# Patient Record
Sex: Female | Born: 1980
Health system: Southern US, Community
[De-identification: ages and names within clinical notes are randomized; demographics above are authoritative.]

## PROBLEM LIST (undated history)

## (undated) DIAGNOSIS — F329 Major depressive disorder, single episode, unspecified: Secondary | ICD-10-CM

## (undated) DIAGNOSIS — F32A Depression, unspecified: Secondary | ICD-10-CM

## (undated) DIAGNOSIS — F419 Anxiety disorder, unspecified: Secondary | ICD-10-CM

---

## 2015-10-22 ENCOUNTER — Emergency Department (HOSPITAL_BASED_OUTPATIENT_CLINIC_OR_DEPARTMENT_OTHER)
Admission: EM | Admit: 2015-10-22 | Discharge: 2015-10-22 | Disposition: A | Discharge status: Home / Self Care | Payer: Medicaid - Out of State | Attending: Emergency Medicine | Admitting: Emergency Medicine

## 2015-10-22 ENCOUNTER — Encounter (HOSPITAL_BASED_OUTPATIENT_CLINIC_OR_DEPARTMENT_OTHER): Payer: Self-pay | Admitting: *Deleted

## 2015-10-22 DIAGNOSIS — B029 Zoster without complications: Secondary | ICD-10-CM | POA: Insufficient documentation

## 2015-10-22 DIAGNOSIS — M25511 Pain in right shoulder: Secondary | ICD-10-CM | POA: Diagnosis present

## 2015-10-22 MED ORDER — HYDROCODONE-ACETAMINOPHEN 5-325 MG PO TABS: 1.0000 | ORAL_TABLET | Freq: Four times a day (QID) | ORAL | Status: DC | PRN

## 2015-10-22 MED ORDER — ACYCLOVIR 400 MG PO TABS: 400.0000 mg | ORAL_TABLET | Freq: Four times a day (QID) | ORAL | Status: DC

## 2015-10-22 MED ORDER — HYDROCODONE-ACETAMINOPHEN 5-325 MG PO TABS
1.0000 | ORAL_TABLET | Freq: Once | ORAL | Status: AC
  Administered 2015-10-22: 1 via ORAL
  Filled 2015-10-22: qty 1

## 2015-10-22 NOTE — ED Notes (Signed)
Right scapula pain x 3 days. No injury. States pain is worse when she takes a deep breath. Denies cough.

## 2015-10-22 NOTE — ED Notes (Signed)
MD at bedside. 

## 2015-10-22 NOTE — ED Provider Notes (Signed)
CSN: [id number]     Arrival date & time 10/22/15  2127 History  By signing my name below, I, Karen Acosta, attest that this documentation has been prepared under the direction and in the presence of Karen Memos, MD. Electronically Signed: Octavia Acosta, ED Scribe. 10/22/2015. 9:57 PM.    Chief Complaint  Patient presents with  . Shoulder Pain      The history is provided by the patient. No language interpreter was used.   HPI Comments: Karen Acosta is a 34 y.o. female who has a hx of chicken pox presents to the Emergency Department complaining of constant, sharp, stabbing, gradual worsening scapula pain onset 3 days ago. She notes having a fever blister pop up today. Pt states increased pain with deep breaths and with movement. 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 Pt denies fever, injury to the area, cough, shortness of breath, swelling in legs, hx of blood clots, recent surgeries, recent travel, abdominal pain, and chest pain. She has no known drug allergies  History reviewed. No pertinent past medical history. Past Surgical History  Procedure Laterality Date  . Cesarean section     No family history on file. Social History  Substance Use Topics  . Smoking status: Never Smoker   . Smokeless tobacco: None  . Alcohol Use: No   OB History    No data available     Review of Systems  Constitutional: Negative for fever.  Respiratory: Negative for cough and shortness of breath.   Cardiovascular: Negative for chest pain.  Gastrointestinal: Negative for abdominal pain.  All other systems reviewed and are negative.     Allergies  Review of patient's allergies indicates no known allergies.  Home Medications   Prior to Admission medications   Medication Sig Start Date End Date Taking? Authorizing Provider  acyclovir (ZOVIRAX) 400 MG tablet Take 1 tablet (400 mg total) by mouth 4 (four) times daily. 10/22/15   Karen Memos, MD   HYDROcodone-acetaminophen (NORCO/VICODIN) 5-325 MG tablet Take 1 tablet by mouth every 6 (six) hours as needed for moderate pain. 10/22/15   Karen Memos, MD   Triage vitals: BP 118/89 mmHg  Pulse 80  Temp(Src) 97.6 F (36.4 C) (Oral)  Resp 18  Ht 5' (1.524 m)  Wt 168 lb (76.204 kg)  BMI 32.81 kg/m2  SpO2 100% Physical Exam  Constitutional: She is oriented to person, place, and time. She appears well-developed and well-nourished. No distress.  HENT:  Head: Normocephalic and atraumatic.  Eyes: EOM are normal.  Neck: Normal range of motion.  Cardiovascular: Normal rate, regular rhythm and normal heart sounds.   Pulmonary/Chest: Effort normal and breath sounds normal.  Abdominal: Soft. She exhibits no distension. There is no tenderness.  Musculoskeletal: Normal range of motion.  2 vesicles periscapular region, slight erythema, no TTP  Neurological: She is alert and oriented to person, place, and time.  Skin: Skin is warm and dry.  Psychiatric: She has a normal mood and affect. Judgment normal.  Nursing note and vitals reviewed.   ED Course  Procedures  DIAGNOSTIC STUDIES: Oxygen Saturation is 100% on RA, normal by my interpretation.  COORDINATION OF CARE:  9:54 PM Discussed treatment plan which includes anti-viral and pain medication with pt at bedside and pt agreed to plan.  Labs Review Labs Reviewed - No data to display  Imaging Review No results found. I have personally reviewed and evaluated these images and lab results as part of  my medical decision-making.   EKG Interpretation None      MDM   Final diagnoses:  Shingles    Concern for likely shingles vs muscular pain. Does have two vesicles around scapula and 'deep pain'. PERC negative, no fever or cough to suggest pneumonia. Also complains of a fever blister on her upper left lip, so has likely been under some kind of stress recently to cause the symptoms. Will dc on antivirals and pain meds.   I  personally performed the services described in this documentation, which was scribed in my presence. The recorded information has been reviewed and is accurate. Cheyenne AdasE   Juwuan Sedita, MD 10/23/15 [phone]

## 2016-01-31 ENCOUNTER — Encounter (HOSPITAL_BASED_OUTPATIENT_CLINIC_OR_DEPARTMENT_OTHER): Payer: Self-pay | Admitting: *Deleted

## 2016-01-31 ENCOUNTER — Emergency Department (HOSPITAL_BASED_OUTPATIENT_CLINIC_OR_DEPARTMENT_OTHER)
Admission: EM | Admit: 2016-01-31 | Discharge: 2016-01-31 | Disposition: A | Discharge status: Home / Self Care | Payer: Medicaid - Out of State | Attending: Emergency Medicine | Admitting: Emergency Medicine

## 2016-01-31 DIAGNOSIS — R109 Unspecified abdominal pain: Secondary | ICD-10-CM | POA: Diagnosis present

## 2016-01-31 DIAGNOSIS — Z3202 Encounter for pregnancy test, result negative: Secondary | ICD-10-CM | POA: Diagnosis not present

## 2016-01-31 DIAGNOSIS — Z79899 Other long term (current) drug therapy: Secondary | ICD-10-CM | POA: Diagnosis not present

## 2016-01-31 DIAGNOSIS — K529 Noninfective gastroenteritis and colitis, unspecified: Secondary | ICD-10-CM | POA: Diagnosis not present

## 2016-01-31 DIAGNOSIS — R51 Headache: Secondary | ICD-10-CM | POA: Insufficient documentation

## 2016-01-31 LAB — BASIC METABOLIC PANEL
ANION GAP: 10 (ref 5–15)
BUN: 8 mg/dL (ref 6–20)
CO2: 20 mmol/L — ABNORMAL LOW (ref 22–32)
Calcium: 8.9 mg/dL (ref 8.9–10.3)
Chloride: 107 mmol/L (ref 101–111)
Creatinine, Ser: 0.58 mg/dL (ref 0.44–1.00)
GFR calc Af Amer: >60 mL/min (ref >60)
Glucose, Bld: 112 mg/dL — ABNORMAL HIGH (ref 65–99)
POTASSIUM: 4 mmol/L (ref 3.5–5.1)
SODIUM: 137 mmol/L (ref 135–145)

## 2016-01-31 LAB — URINALYSIS, ROUTINE W REFLEX MICROSCOPIC
Bilirubin Urine: NEGATIVE
Glucose, UA: NEGATIVE mg/dL
HGB URINE DIPSTICK: NEGATIVE
KETONES UR: NEGATIVE mg/dL
LEUKOCYTES UA: NEGATIVE
NITRITE: NEGATIVE
PROTEIN: NEGATIVE mg/dL
Specific Gravity, Urine: 1.02 (ref 1.005–1.030)
pH: 6.5 (ref 5.0–8.0)

## 2016-01-31 LAB — CBC WITH DIFFERENTIAL/PLATELET
BASOS ABS: 0 10*3/uL (ref 0.0–0.1)
BASOS PCT: 0 %
EOS PCT: 2 %
Eosinophils Absolute: 0.1 10*3/uL (ref 0.0–0.7)
HCT: 37.5 % (ref 36.0–46.0)
Hemoglobin: 12.8 g/dL (ref 12.0–15.0)
LYMPHS PCT: 22 %
Lymphs Abs: 1.3 10*3/uL (ref 0.7–4.0)
MCH: 29.8 pg (ref 26.0–34.0)
MCHC: 34.1 g/dL (ref 30.0–36.0)
MCV: 87.2 fL (ref 78.0–100.0)
Monocytes Absolute: 0.6 10*3/uL (ref 0.1–1.0)
Monocytes Relative: 9 %
Neutro Abs: 4 10*3/uL (ref 1.7–7.7)
Neutrophils Relative %: 67 %
PLATELETS: 249 10*3/uL (ref 150–400)
RBC: 4.3 MIL/uL (ref 3.87–5.11)
RDW: 13 % (ref 11.5–15.5)
WBC: 5.9 10*3/uL (ref 4.0–10.5)

## 2016-01-31 LAB — PREGNANCY, URINE: PREG TEST UR: NEGATIVE

## 2016-01-31 MED ORDER — KETOROLAC TROMETHAMINE 30 MG/ML IJ SOLN
30.0000 mg | Freq: Once | INTRAMUSCULAR | Status: AC
  Administered 2016-01-31: 30 mg via INTRAVENOUS
  Filled 2016-01-31: qty 1

## 2016-01-31 MED ORDER — SODIUM CHLORIDE 0.9 % IV BOLUS (SEPSIS)
1000.0000 mL | Freq: Once | INTRAVENOUS | Status: AC
  Administered 2016-01-31: 1000 mL via INTRAVENOUS

## 2016-01-31 NOTE — ED Provider Notes (Signed)
CSN: [id number]     Arrival date & time 01/31/16  1510 History  By signing my name below, I, Tanda RockersMargaux Venter, attest that this documentation has been prepared under the direction and in the presence of Rolan BuccoMelanie Contrell Ballentine, MD. Electronically Signed: Tanda RockersMargaux Venter, ED Scribe. 01/31/2016. 3:41 PM.   Chief Complaint  Patient presents with  . Abdominal Pain  . Emesis  . Diarrhea   The history is provided by the patient. No language interpreter was used.     HPI Comments: Karen Acosta is a 35 y.o. female who presents to the Emergency Department complaining of nausea and vomiting x 3 days. She reports 3 episodes of vomiting today. She did drink apple juice earlier today and was able to keep it down. Pt also complains of a frontal headache, intermittent abdominal pain, diarrhea, and fever with Tmax 102. Pt mentions that the fever has since resolved. Her temperature in the ED is 98.5 F. She has been taking Ibuprofen and Tylenol for the headache with some relief. Denies dysuria, rhinorrhea, congestion, cough, or any other associated symptoms. LNMP: 01/16/2016.   History reviewed. No pertinent past medical history. Past Surgical History  Procedure Laterality Date  . Cesarean section     History reviewed. No pertinent family history. Social History  Substance Use Topics  . Smoking status: Never Smoker   . Smokeless tobacco: None  . Alcohol Use: No   OB History    No data available     Review of Systems  Constitutional: Positive for fever. Negative for chills, diaphoresis and fatigue.  HENT: Negative for congestion, rhinorrhea and sneezing.   Eyes: Negative.   Respiratory: Negative for cough, chest tightness and shortness of breath.   Cardiovascular: Negative for chest pain and leg swelling.  Gastrointestinal: Positive for nausea, vomiting, abdominal pain and diarrhea. Negative for blood in stool.  Genitourinary: Negative for dysuria, frequency, hematuria, flank pain and difficulty urinating.   Musculoskeletal: Negative for back pain and arthralgias.  Skin: Negative for rash.  Neurological: Positive for headaches. Negative for dizziness, speech difficulty, weakness and numbness.    Allergies  Review of patient's allergies indicates no known allergies.  Home Medications   Prior to Admission medications   Medication Sig Start Date End Date Taking? Authorizing Provider  acyclovir (ZOVIRAX) 400 MG tablet Take 1 tablet (400 mg total) by mouth 4 (four) times daily. 10/22/15   Marily MemosJason Mesner, MD  HYDROcodone-acetaminophen (NORCO/VICODIN) 5-325 MG tablet Take 1 tablet by mouth every 6 (six) hours as needed for moderate pain. 10/22/15   Jason Mesner, MD   BP 100/64 mmHg  Pulse 90  Temp(Src) 98.5 F (36.9 C) (Oral)  Resp 20  Ht 5\' 2"  (1.575 m)  Wt 166 lb (75.297 kg)  BMI 30.35 kg/m2  SpO2 100%  LMP 01/16/2016   Physical Exam  Constitutional: She is oriented to person, place, and time. She appears well-developed and well-nourished.  HENT:  Head: Normocephalic and atraumatic.  Eyes: Pupils are equal, round, and reactive to light.  Neck: Normal range of motion. Neck supple.  Cardiovascular: Normal rate, regular rhythm and normal heart sounds.   Pulmonary/Chest: Effort normal and breath sounds normal. No respiratory distress. She has no wheezes. She has no rales. She exhibits no tenderness.  Abdominal: Soft. Bowel sounds are normal. There is tenderness. There is no rebound and no guarding.  Mild tenderness across lower abdomen bilaterally  Musculoskeletal: Normal range of motion. She exhibits no edema.  Lymphadenopathy:    She has no cervical  adenopathy.  Neurological: She is alert and oriented to person, place, and time.  Skin: Skin is warm and dry. No rash noted.  Psychiatric: She has a normal mood and affect.    ED Course  Procedures (including critical care time)  DIAGNOSTIC STUDIES: Oxygen Saturation is 100% on RA, normal by my interpretation.    COORDINATION OF  CARE: 3:39 PM-Discussed treatment plan which includes IV Fluids with pt at bedside and pt agreed to plan.   Labs Review Results for orders placed or performed during the hospital encounter of 01/31/16  Basic metabolic panel  Result Value Ref Range   Sodium 137 135 - 145 mmol/L   Potassium 4.0 3.5 - 5.1 mmol/L   Chloride 107 101 - 111 mmol/L   CO2 20 (L) 22 - 32 mmol/L   Glucose, Bld 112 (H) 65 - 99 mg/dL   BUN 8 6 - 20 mg/dL   Creatinine, Ser 1.61 0.44 - 1.00 mg/dL   Calcium 8.9 8.9 - 09.6 mg/dL   GFR calc non Af Amer >60 >60 mL/min   GFR calc Af Amer >60 >60 mL/min   Anion gap 10 5 - 15  CBC with Differential  Result Value Ref Range   WBC 5.9 4.0 - 10.5 K/uL   RBC 4.30 3.87 - 5.11 MIL/uL   Hemoglobin 12.8 12.0 - 15.0 g/dL   HCT 04.5 40.9 - 81.1 %   MCV 87.2 78.0 - 100.0 fL   MCH 29.8 26.0 - 34.0 pg   MCHC 34.1 30.0 - 36.0 g/dL   RDW 91.4 78.2 - 95.6 %   Platelets 249 150 - 400 K/uL   Neutrophils Relative % 67 %   Neutro Abs 4.0 1.7 - 7.7 K/uL   Lymphocytes Relative 22 %   Lymphs Abs 1.3 0.7 - 4.0 K/uL   Monocytes Relative 9 %   Monocytes Absolute 0.6 0.1 - 1.0 K/uL   Eosinophils Relative 2 %   Eosinophils Absolute 0.1 0.0 - 0.7 K/uL   Basophils Relative 0 %   Basophils Absolute 0.0 0.0 - 0.1 K/uL  Urinalysis, Routine w reflex microscopic  Result Value Ref Range   Color, Urine YELLOW YELLOW   APPearance CLEAR CLEAR   Specific Gravity, Urine 1.020 1.005 - 1.030   pH 6.5 5.0 - 8.0   Glucose, UA NEGATIVE NEGATIVE mg/dL   Hgb urine dipstick NEGATIVE NEGATIVE   Bilirubin Urine NEGATIVE NEGATIVE   Ketones, ur NEGATIVE NEGATIVE mg/dL   Protein, ur NEGATIVE NEGATIVE mg/dL   Nitrite NEGATIVE NEGATIVE   Leukocytes, UA NEGATIVE NEGATIVE  Pregnancy, urine  Result Value Ref Range   Preg Test, Ur NEGATIVE NEGATIVE   No results found.    Imaging Review No results found. I have personally reviewed and evaluated these lab results as part of my medical decision-making.    EKG Interpretation None      MDM   Final diagnoses:  Gastroenteritis   Patient presents with nausea vomiting diarrhea and fever. She's been fever free for 24 hours. She's tolerating by mouth fluids. Her abdominal exam is benign. She was given IV fluids and a dose of Toradol for her headache. She states she's feeling much better. She's drinking without difficulty. She states her headache is resolved. She has only minimal tenderness in her suprapubic area but states it's almost resolved. Her urinalysis is negative without infection. Her pregnancy test is negative. I feel her symptoms are most consistent with a viral gastroenteritis. She was discharged home in good condition.  She was advised to use clear liquids for the next 24 hours and slowly progress after that. Return precautions were given.  I personally performed the services described in this documentation, which was scribed in my presence.  The recorded information has been reviewed and considered.      Rolan Bucco, MD 01/31/16 [id number]

## 2016-01-31 NOTE — ED Notes (Signed)
Per pt and spouse pt has been experiencing n/v/d and abd pain w/ associated headache x3 days - fever of 102.7 at home.

## 2016-03-09 ENCOUNTER — Encounter (HOSPITAL_BASED_OUTPATIENT_CLINIC_OR_DEPARTMENT_OTHER): Payer: Self-pay | Admitting: *Deleted

## 2016-03-09 ENCOUNTER — Emergency Department (HOSPITAL_BASED_OUTPATIENT_CLINIC_OR_DEPARTMENT_OTHER)
Admission: EM | Admit: 2016-03-09 | Discharge: 2016-03-09 | Disposition: A | Discharge status: Home / Self Care | Payer: Medicaid - Out of State | Attending: Emergency Medicine | Admitting: Emergency Medicine

## 2016-03-09 DIAGNOSIS — J029 Acute pharyngitis, unspecified: Secondary | ICD-10-CM | POA: Diagnosis present

## 2016-03-09 DIAGNOSIS — H109 Unspecified conjunctivitis: Secondary | ICD-10-CM | POA: Insufficient documentation

## 2016-03-09 DIAGNOSIS — J309 Allergic rhinitis, unspecified: Secondary | ICD-10-CM | POA: Insufficient documentation

## 2016-03-09 LAB — RAPID STREP SCREEN (MED CTR MEBANE ONLY): STREPTOCOCCUS, GROUP A SCREEN (DIRECT): NEGATIVE

## 2016-03-09 MED ORDER — POLYMYXIN B-TRIMETHOPRIM 10000-0.1 UNIT/ML-% OP SOLN: 1.0000 [drp] | OPHTHALMIC | Status: DC

## 2016-03-09 NOTE — ED Provider Notes (Signed)
CSN: [id number]     Arrival date & time 03/09/16  1842 History   First MD Initiated Contact with Patient 03/09/16 1924     Chief Complaint  Patient presents with  . Sore Throat     (Consider location/radiation/quality/duration/timing/severity/associated sxs/prior Treatment) HPI   Blood pressure 113/79, pulse 87, temperature 98.1 F (36.7 C), temperature source Oral, resp. rate 18, height 4\' 11"  (1.499 m), weight 72.576 kg, last menstrual period 02/10/2016, SpO2 99 %.  Karen Acosta is a 35 y.o. female complaining of emergency department for complaints and eye discharge x 3 days. States three days ago she awoke with her eyes crusted shut with associated yellow discharge throughout the day Has progressed to both eyes upon awakening this morning. Has been taking OTC allergy eye drops without relief. Denies fever, visual changes, trauma, eye pain or photophobia. Also reports an increase in her typical seasonal allergies, corzya, pruritis of the throat and ears, and sneezing. She denies trying any relieving medications for this. Denies dysphagia, odonophagia, chest pain, sob, doe or cough.   History reviewed. No pertinent past medical history. Past Surgical History  Procedure Laterality Date  . Cesarean section     History reviewed. No pertinent family history. Social History  Substance Use Topics  . Smoking status: Never Smoker   . Smokeless tobacco: None  . Alcohol Use: No   OB History    No data available     Review of Systems  10 systems reviewed and found to be negative, except as noted in the HPI.  Allergies  Review of patient's allergies indicates no known allergies.  Home Medications   Prior to Admission medications   Not on File   BP 113/79 mmHg  Pulse 87  Temp(Src) 98.1 F (36.7 C) (Oral)  Resp 18  Ht 4\' 11"  (1.499 m)  Wt 72.576 kg  BMI 32.30 kg/m2  SpO2 99%  LMP 02/10/2016 Physical Exam  Constitutional: She is oriented to person, place, and time. She  appears well-developed and well-nourished. No distress.  HENT:  Head: Normocephalic and atraumatic.  Right Ear: External ear normal.  Left Ear: External ear normal.  Mouth/Throat: Oropharynx is clear and moist. No oropharyngeal exudate.  No drooling or stridor. Posterior pharynx mildly erythematous no significant tonsillar hypertrophy. No exudate. Soft palate rises symmetrically. No TTP or induration under tongue.   No tenderness to palpation of frontal or bilateral maxillary sinuses.  Mild mucosal edema in the nares with scant rhinorrhea.  Bilateral tympanic membranes with normal architecture and good light reflex.    Eyes: EOM are normal. Pupils are equal, round, and reactive to light.  Slight bilateral conjunctival injection with clear discharge.  Neck: Normal range of motion. Neck supple.  Cardiovascular: Normal rate, regular rhythm and intact distal pulses.   Pulmonary/Chest: Effort normal and breath sounds normal. No stridor. No respiratory distress. She has no wheezes. She has no rales. She exhibits no tenderness.  Abdominal: Soft. Bowel sounds are normal. She exhibits no distension and no mass. There is no tenderness. There is no rebound and no guarding.  Musculoskeletal: Normal range of motion.  Neurological: She is alert and oriented to person, place, and time.  Psychiatric: She has a normal mood and affect.  Nursing note and vitals reviewed.   ED Course  Procedures (including critical care time) Labs Review Labs Reviewed  RAPID STREP SCREEN (NOT AT [facility])  CULTURE, GROUP A STREP [facility])    Imaging Review No results found. I have personally reviewed and  evaluated these images and lab results as part of my medical decision-making.   EKG Interpretation None      MDM   Final diagnoses:  Bilateral conjunctivitis  Allergic rhinitis, unspecified allergic rhinitis type    Filed Vitals:   03/09/16 1856  BP: 113/79  Pulse: 87  Temp: 98.1 F (36.7 C)  TempSrc:  Oral  Resp: 18  Height:  (1.499 m)  Weight: 72.576 kg  SpO2: 99%    Karen Acosta is 35 y.o. female presenting with Symptoms consistent with allergic rhinitis. Also reporting crusting eye wakening. Very mild conjunctival injection. I think the symptoms are likely related to her allergies however, daughter is also sick with conjunctivitis, will start her on Polytrim. Patient is only taking over-the-counter allergy eyedrops, recommend Zyrtec and close follow with primary care, referral to allergist given.  Evaluation does not show pathology that would require ongoing emergent intervention or inpatient treatment. Pt is hemodynamically stable and mentating appropriately. Discussed findings and plan with patient/guardian, who agrees with care plan. All questions answered. Return precautions discussed and outpatient follow up given.   Discharge Medication List as of 03/09/2016  7:59 PM    START taking these medications   Details  trimethoprim-polymyxin b (POLYTRIM) ophthalmic solution Place 1 drop into both eyes every 4 (four) hours., Starting 03/09/2016, Until Discontinued, Print             Wynetta Emery, PA-C 03/09/16 2137  Melene Plan, DO 03/09/16 2233

## 2016-03-09 NOTE — Discharge Instructions (Signed)
Takes Zyrtec over-the-counter as instructed.  Please follow with your primary care doctor in the next 2 days for a check-up. They must obtain records for further management.   Do not hesitate to return to the Emergency Department for any new, worsening or concerning symptoms.    Allergies An allergy is an abnormal reaction to a substance by the body's defense system (immune system). Allergies can develop at any age. WHAT CAUSES ALLERGIES? An allergic reaction happens when the immune system mistakenly reacts to a normally harmless substance, called an allergen, as if it were harmful. The immune system releases antibodies to fight the substance. Antibodies eventually release a chemical called histamine into the bloodstream. The release of histamine is meant to protect the body from infection, but it also causes discomfort. An allergic reaction can be triggered by:  Eating an allergen.  Inhaling an allergen.  Touching an allergen. WHAT TYPES OF ALLERGIES ARE THERE? There are many types of allergies. Common types include:  Seasonal allergies. People with this type of allergy are usually allergic to substances that are only present during certain seasons, such as molds and pollens.  Food allergies.  Drug allergies.  Insect allergies.  Animal dander allergies. WHAT ARE SYMPTOMS OF ALLERGIES? Possible allergy symptoms include:  Swelling of the lips, face, tongue, mouth, or throat.  Sneezing, coughing, or wheezing.  Nasal congestion.  Tingling in the mouth.  Rash.  Itching.  Itchy, red, swollen areas of skin (hives).  Watery eyes.  Vomiting.  Diarrhea.  Dizziness.  Lightheadedness.  Fainting.  Trouble breathing or swallowing.  Chest tightness.  Rapid heartbeat. HOW ARE ALLERGIES DIAGNOSED? Allergies are diagnosed with a medical and family history and one or more of the following:  Skin tests.  Blood tests.  A food diary. A food diary is a record of all  the foods and drinks you have in a day and of all the symptoms you experience.  The results of an elimination diet. An elimination diet involves eliminating foods from your diet and then adding them back in one by one to find out if a certain food causes an allergic reaction. HOW ARE ALLERGIES TREATED? There is no cure for allergies, but allergic reactions can be treated with medicine. Severe reactions usually need to be treated at a hospital. HOW CAN REACTIONS BE PREVENTED? The best way to prevent an allergic reaction is by avoiding the substance you are allergic to. Allergy shots and medicines can also help prevent reactions in some cases. People with severe allergic reactions may be able to prevent a life-threatening reaction called anaphylaxis with a medicine given right after exposure to the allergen.   This information is not intended to replace advice given to you by your health care provider. Make sure you discuss any questions you have with your health care provider.   Document Released: 02/07/2003 Document Revised: 12/05/2014 Document Reviewed: 08/26/2014 Elsevier Interactive Patient Education [employer]2016 [employer]

## 2016-03-09 NOTE — ED Notes (Signed)
sore throat, itchy eyes x several days.

## 2016-03-12 LAB — CULTURE, GROUP A STREP (THRC)

## 2016-04-22 ENCOUNTER — Emergency Department (HOSPITAL_COMMUNITY)
Admission: EM | Admit: 2016-04-22 | Discharge: 2016-04-23 | Disposition: A | Discharge status: Home / Self Care | Payer: Medicaid Other | Attending: Emergency Medicine | Admitting: Emergency Medicine

## 2016-04-22 ENCOUNTER — Emergency Department (HOSPITAL_COMMUNITY): Payer: Medicaid Other

## 2016-04-22 DIAGNOSIS — S8991XA Unspecified injury of right lower leg, initial encounter: Secondary | ICD-10-CM | POA: Diagnosis present

## 2016-04-22 DIAGNOSIS — Y929 Unspecified place or not applicable: Secondary | ICD-10-CM | POA: Diagnosis not present

## 2016-04-22 DIAGNOSIS — Y999 Unspecified external cause status: Secondary | ICD-10-CM | POA: Diagnosis not present

## 2016-04-22 DIAGNOSIS — S8391XA Sprain of unspecified site of right knee, initial encounter: Secondary | ICD-10-CM | POA: Insufficient documentation

## 2016-04-22 DIAGNOSIS — Y9301 Activity, walking, marching and hiking: Secondary | ICD-10-CM | POA: Diagnosis not present

## 2016-04-22 DIAGNOSIS — X509XXA Other and unspecified overexertion or strenuous movements or postures, initial encounter: Secondary | ICD-10-CM | POA: Diagnosis not present

## 2016-04-22 MED ORDER — NAPROXEN 500 MG PO TABS: 500.0000 mg | ORAL_TABLET | Freq: Two times a day (BID) | ORAL | Status: DC

## 2016-04-22 MED ORDER — TRAMADOL HCL 50 MG PO TABS: 50.0000 mg | ORAL_TABLET | Freq: Four times a day (QID) | ORAL | Status: DC | PRN

## 2016-04-22 MED ORDER — HYDROCODONE-ACETAMINOPHEN 5-325 MG PO TABS
2.0000 | ORAL_TABLET | Freq: Once | ORAL | Status: AC
  Administered 2016-04-22: 2 via ORAL
  Filled 2016-04-22: qty 2

## 2016-04-22 NOTE — ED Notes (Signed)
Patient transported to X-ray 

## 2016-04-22 NOTE — ED Provider Notes (Signed)
CSN: [id number]     Arrival date & time 04/22/16  2217 History  By signing my name below, I, Placido Sou, attest that this documentation has been prepared under the direction and in the presence of [employer], PA-C. Electronically Signed: Placido Sou, ED Scribe. 04/22/2016. 10:57 PM.   Chief Complaint  Patient presents with  . Knee Pain   The history is provided by the patient. No language interpreter was used.    HPI Comments: Karen Acosta is a 35 y.o. female who presents to the Emergency Department by [facility] complaining of constant, moderate, right knee pain onset PTA. Pt states that she was walking down the stairs and twisted her right knee resulting in her symptoms. Her pain worsens with movement or when bearing weight. She notes associated, mild, right knee swelling. Pt denies having been ambulatory since the accident due to her pain. She denies a hx of right knee injuries. Pt denies having taken anything for pain management. She denies any other associated symptoms at this time.    No past medical history on file. Past Surgical History  Procedure Laterality Date  . Cesarean section     No family history on file. Social History  Substance Use Topics  . Smoking status: Never Smoker   . Smokeless tobacco: Not on file  . Alcohol Use: No   OB History    No data available      Review of Systems  Musculoskeletal: Positive for joint swelling and arthralgias.  Skin: Negative for wound.  All other systems reviewed and are negative.   Allergies  Review of patient's allergies indicates no known allergies.  Home Medications   Prior to Admission medications   Medication Sig Start Date End Date Taking? Authorizing Provider  trimethoprim-polymyxin b (POLYTRIM) ophthalmic solution Place 1 drop into both eyes every 4 (four) hours. 03/09/16   Nicole Pisciotta, PA-C   BP 100/71 mmHg  Pulse 64  Temp(Src) 98.4 F (36.9 C) (Oral)  Resp 14  SpO2 95%  LMP 04/21/2016    Physical Exam  Constitutional: She is oriented to person, place, and time. She appears well-developed and well-nourished. No distress.  HENT:  Head: Normocephalic and atraumatic.  Eyes: Conjunctivae and EOM are normal. No scleral icterus.  Neck: Normal range of motion.  Cardiovascular: Normal rate, regular rhythm and intact distal pulses.   DP and PT pulses 2+ in the RLE  Pulmonary/Chest: Effort normal. No respiratory distress.  Respirations even and unlabored  Musculoskeletal: Normal range of motion.       Right knee: She exhibits swelling (mild) and bony tenderness (mild, medial). She exhibits normal range of motion, no effusion, no deformity, no erythema, no LCL laxity and no MCL laxity. Tenderness found. Medial joint line tenderness noted.  Neurological: She is alert and oriented to person, place, and time. She exhibits normal muscle tone. Coordination normal.  Sensation to light touch intact in the RLE. Patient able to wiggle all toes.  Skin: Skin is warm and dry. No rash noted. She is not diaphoretic. No erythema. No pallor.  Psychiatric: She has a normal mood and affect. Her behavior is normal.  Nursing note and vitals reviewed.   ED Course  Procedures  DIAGNOSTIC STUDIES: Oxygen Saturation is 95% on RA, normal by my interpretation.    COORDINATION OF CARE: 10:55 PM Discussed next steps with pt including medication for pain management and reevaluation based on imaging results. Pt verbalized understanding and is agreeable with the plan.   Labs Review  Labs Reviewed - No data to display  Imaging Review Dg Knee Complete 4 Views Right  04/22/2016  CLINICAL DATA:  Initial encounter for Medial right knee pain. Pt states she twisted her right knee while walking down stairs PTA EXAM: RIGHT KNEE - COMPLETE 4+ VIEW COMPARISON:  None. FINDINGS: No acute fracture or dislocation. No joint effusion. Joint spaces are maintained for age. IMPRESSION: No acute osseous abnormality.  Electronically Signed   By: Jeronimo GreavesKyle  Talbot M.D.   On: 04/22/2016 23:05   I have personally reviewed and evaluated these images as part of my medical decision-making.   EKG Interpretation None      MDM   Final diagnoses:  Knee sprain, right, initial encounter    35 year old female presents to the emergency department for evaluation of right knee pain after twisting her knee when walking down steps. Patient is neurovascularly intact. Range of motion is preserved. There is medial tenderness without bony deformity or crepitus. No effusion noted. X-ray negative for bony deformity or fracture. Symptoms consistent with knee sprain. Will manage with NSAIDs and knee sleeve. Patient given crutches for WBAT. Orthopedic referral given and return precautions discussed. Patient discharged in satisfactory condition with no unaddressed concerns.  I personally performed the services described in this documentation, which was scribed in my presence. The recorded information has been reviewed and is accurate.    Filed Vitals:   04/22/16 2227  BP: 100/71  Pulse: 64  Temp: 98.4 F (36.9 C)  TempSrc: Oral  Resp: 14  SpO2: 95%      Antony MaduraKelly Ahleah Simko, PA-C 04/22/16 2337  Rolan BuccoMelanie Belfi, MD 04/22/16 2345

## 2016-04-22 NOTE — Discharge Instructions (Signed)

## 2016-04-22 NOTE — ED Notes (Signed)
Bed: [id number] Expected date:  Expected time:  Means of arrival:  Comments: EMS 34yo F fall / rt knee pain

## 2016-04-22 NOTE — ED Notes (Signed)
According to EMS, pt experienced mechanical fall walking down steps. Pt c/o right knee pain w/ minor swelling. No deformity noted. Pt arrives A+OX4, speaking in complete sentences.   EMS Vitals 124/90 100 16

## 2016-07-19 ENCOUNTER — Encounter (HOSPITAL_BASED_OUTPATIENT_CLINIC_OR_DEPARTMENT_OTHER): Payer: Self-pay | Admitting: *Deleted

## 2016-07-19 ENCOUNTER — Emergency Department (HOSPITAL_BASED_OUTPATIENT_CLINIC_OR_DEPARTMENT_OTHER)
Admission: EM | Admit: 2016-07-19 | Discharge: 2016-07-19 | Disposition: A | Discharge status: Home / Self Care | Payer: Medicaid - Out of State | Attending: Emergency Medicine | Admitting: Emergency Medicine

## 2016-07-19 DIAGNOSIS — R519 Headache, unspecified: Secondary | ICD-10-CM

## 2016-07-19 DIAGNOSIS — R51 Headache: Secondary | ICD-10-CM | POA: Insufficient documentation

## 2016-07-19 DIAGNOSIS — R0981 Nasal congestion: Secondary | ICD-10-CM

## 2016-07-19 DIAGNOSIS — R0982 Postnasal drip: Secondary | ICD-10-CM | POA: Insufficient documentation

## 2016-07-19 MED ORDER — PSEUDOEPHEDRINE HCL 30 MG PO TABS: 30.0000 mg | ORAL_TABLET | Freq: Four times a day (QID) | ORAL | 0 refills | Status: DC | PRN

## 2016-07-19 MED ORDER — SALINE SPRAY 0.65 % NA SOLN: 1.0000 | NASAL | 0 refills | Status: DC | PRN

## 2016-07-19 MED ORDER — AMOXICILLIN-POT CLAVULANATE 875-125 MG PO TABS: 1.0000 | ORAL_TABLET | Freq: Two times a day (BID) | ORAL | 0 refills | Status: DC

## 2016-07-19 MED FILL — AMOX-CLAV 875-125 MG TABLET: 875-125 | 7 days supply | Qty: 14 | Fill #0

## 2016-07-19 MED FILL — SUDOGEST 30 MG TABLET: 30 | 7 days supply | Qty: 30 | Fill #0

## 2016-07-19 MED FILL — DEEP SEA 0.65% NOSE SPRAY: 0.65 | 15 days supply | Qty: 44 | Fill #0

## 2016-07-19 NOTE — ED Provider Notes (Signed)
[facility] Provider Note   CSN: [id number] Arrival date & time: 07/19/16  0944     History   Chief Complaint Chief Complaint  Patient presents with  . Facial Pain    HPI Karen Acosta is a 35 y.o. female.  Patient presents to the emergency department with chief complaint of sinus congestion and sinus headache. He states that she has had the symptoms for the past 5 days. She reports associated stuffy nose, occasional rhinorrhea, and headache. She has tried taking ibuprofen with no relief. She denies fevers or chills. There are no modifying factors. She states that the symptoms keep her from sleeping well at night. There are no other associated symptoms.   The history is provided by the patient. No language interpreter was used.    History reviewed. No pertinent past medical history.  There are no active problems to display for this patient.   Past Surgical History:  Procedure Laterality Date  . CESAREAN SECTION      OB History    No data available       Home Medications    Prior to Admission medications   Not on File    Family History History reviewed. No pertinent family history.  Social History Social History  Substance Use Topics  . Smoking status: Never Smoker  . Smokeless tobacco: Never Used  . Alcohol use No     Allergies   Review of patient's allergies indicates no known allergies.   Review of Systems Review of Systems  Constitutional: Negative for chills and fever.  HENT: Positive for postnasal drip, rhinorrhea, sinus pressure and sneezing. Negative for sore throat.   Respiratory: Negative for cough and shortness of breath.   Cardiovascular: Negative for chest pain.  Gastrointestinal: Negative for abdominal pain, constipation, diarrhea, nausea and vomiting.  Genitourinary: Negative for dysuria.  All other systems reviewed and are negative.    Physical Exam Updated Vital Signs BP 107/73 (BP Location: Left Arm)   Pulse  68   Temp 98.4 F (36.9 C) (Oral)   Resp 18   Ht 5\' 4"  (1.626 m)   Wt 72.6 kg   LMP 07/15/2016   SpO2 100%   BMI 27.46 kg/m   Physical Exam  Constitutional: She is oriented to person, place, and time. She appears well-developed and well-nourished.  HENT:  Head: Normocephalic and atraumatic.  Right Ear: External ear normal.  Left Ear: External ear normal.  Mouth/Throat: Oropharynx is clear and moist. No oropharyngeal exudate.  Swollen, erythematous turbinates, maxillary sinuses tender to palpation  Eyes: Conjunctivae and EOM are normal. Pupils are equal, round, and reactive to light.  Neck: Normal range of motion. Neck supple.  Cardiovascular: Normal rate, regular rhythm and normal heart sounds.   Pulmonary/Chest: Effort normal and breath sounds normal. No respiratory distress. She has no wheezes. She has no rales. She exhibits no tenderness.  Abdominal: Soft. Bowel sounds are normal.  Musculoskeletal: Normal range of motion.  Neurological: She is alert and oriented to person, place, and time.  Skin: Skin is warm and dry.  Psychiatric: She has a normal mood and affect. Her behavior is normal. Judgment and thought content normal.  Nursing note and vitals reviewed.    ED Treatments / Results  Labs (all labs ordered are listed, but only abnormal results are displayed) Labs Reviewed - No data to display  EKG  EKG Interpretation None       Radiology No results found.  Procedures Procedures (including critical care time)  Medications Ordered in ED Medications - No data to display   Initial Impression / Assessment and Plan / ED Course  I have reviewed the triage vital signs and the nursing notes.  Pertinent labs & imaging results that were available during my care of the patient were reviewed by me and considered in my medical decision making (see chart for details).  Clinical Course    Patient with sinus congestion, possible sinus infection, I will give patient  antibiotic, but I've instructed her to wait 2 additional days to see if her symptoms clear prior to beginning the antibiotic. This will make one week of symptoms, at which point I would recommend treatment. Will give her nasal saline as well as a decongestant.  Final Clinical Impressions(s) / ED Diagnoses   Final diagnoses:  Sinus congestion  Sinus headache    New Prescriptions New Prescriptions   No medications on file     Roxy HorsemanRobert Jarmar Rousseau, PA-C 07/19/16 1054    Shaune Pollackameron Isaacs, MD 07/20/16 [phone]

## 2016-07-19 NOTE — ED Triage Notes (Addendum)
Pt states she feel pressure in her face and "my eyes feel swollen", and that she has been sneezing and has nasal congestion.

## 2016-07-19 NOTE — ED Triage Notes (Signed)
Pt reports headaches off and on x 5 years, has not seen a doctor, states she is here today because she cannot sleep. This headache started 5 days ago and she cannot sleep at night due to the pain. Pt states she usually has n/v with her headaches, but none with this headache today.

## 2016-11-03 ENCOUNTER — Inpatient Hospital Stay (HOSPITAL_COMMUNITY)
Admission: AD | Admit: 2016-11-03 | Discharge: 2016-11-03 | Disposition: A | Discharge status: Home / Self Care | Payer: Medicaid - Out of State | Source: Ambulatory Visit | Attending: Obstetrics and Gynecology | Admitting: Obstetrics and Gynecology

## 2016-11-03 ENCOUNTER — Encounter (HOSPITAL_COMMUNITY): Payer: Self-pay | Admitting: *Deleted

## 2016-11-03 DIAGNOSIS — F411 Generalized anxiety disorder: Secondary | ICD-10-CM | POA: Diagnosis not present

## 2016-11-03 DIAGNOSIS — F32A Depression, unspecified: Secondary | ICD-10-CM

## 2016-11-03 DIAGNOSIS — F329 Major depressive disorder, single episode, unspecified: Secondary | ICD-10-CM

## 2016-11-03 DIAGNOSIS — F418 Other specified anxiety disorders: Secondary | ICD-10-CM | POA: Insufficient documentation

## 2016-11-03 HISTORY — DX: Anxiety disorder, unspecified: F41.9

## 2016-11-03 HISTORY — DX: Depression, unspecified: F32.A

## 2016-11-03 HISTORY — DX: Major depressive disorder, single episode, unspecified: F32.9

## 2016-11-03 NOTE — Discharge Instructions (Signed)
Generalized Anxiety Disorder Generalized anxiety disorder (GAD) is a mental disorder. It interferes with life functions, including relationships, work, and school. GAD is different from normal anxiety, which everyone experiences at some point in their lives in response to specific life events and activities. Normal anxiety actually helps us prepare for and get through these life events and activities. Normal anxiety goes away after the event or activity is over.  GAD causes anxiety that is not necessarily related to specific events or activities. It also causes excess anxiety in proportion to specific events or activities. The anxiety associated with GAD is also difficult to control. GAD can vary from mild to severe. People with severe GAD can have intense waves of anxiety with physical symptoms (panic attacks).  SYMPTOMS The anxiety and worry associated with GAD are difficult to control. This anxiety and worry are related to many life events and activities and also occur more days than not for 6 months or longer. People with GAD also have three or more of the following symptoms (one or more in children):  Restlessness.   Fatigue.  Difficulty concentrating.   Irritability.  Muscle tension.  Difficulty sleeping or unsatisfying sleep. DIAGNOSIS GAD is diagnosed through an assessment by your health care provider. Your health care provider will ask you questions aboutyour mood,physical symptoms, and events in your life. Your health care provider may ask you about your medical history and use of alcohol or drugs, including prescription medicines. Your health care provider may also do a physical exam and blood tests. Certain medical conditions and the use of certain substances can cause symptoms similar to those associated with GAD. Your health care provider may refer you to a mental health specialist for further evaluation. TREATMENT The following therapies are usually used to treat GAD:    Medication. Antidepressant medication usually is prescribed for long-term daily control. Antianxiety medicines may be added in severe cases, especially when panic attacks occur.   Talk therapy (psychotherapy). Certain types of talk therapy can be helpful in treating GAD by providing support, education, and guidance. A form of talk therapy called cognitive behavioral therapy can teach you healthy ways to think about and react to daily life events and activities.  Stress managementtechniques. These include yoga, meditation, and exercise and can be very helpful when they are practiced regularly. A mental health specialist can help determine which treatment is best for you. Some people see improvement with one therapy. However, other people require a combination of therapies. This information is not intended to replace advice given to you by your health care provider. Make sure you discuss any questions you have with your health care provider. Document Released: 03/11/2013 Document Revised: 12/05/2014 Document Reviewed: 03/11/2013 Elsevier Interactive Patient Education  2017 Elsevier Inc.   Persistent Depressive Disorder, Adult Persistent depressive disorder (PDD) is a mental health condition that causes symptoms of low-level depression for 2 years or longer. It may also be called long-term (chronic) depression or dysthymia. PDD may include episodes of more severe depression that last for about 2 weeks (major depressive disorder or MDD). PDD can affect the way you think, feel, and sleep. This condition may also affect your relationships. You may be more likely to get sick if you have PDD. What are the causes? The exact cause of this condition is not known. PDD is most likely caused by a combination of things, which may include:  Genetic factors. These are traits that are passed along from parent to child.  Individual factors. Your  personality, your behavior, and the way you handle your thoughts  and feelings may contribute to PDD. This includes personality traits and behaviors learned from others.  Physical factors, such as:  Differences in the part of your brain that controls emotion. This part of your brain may be different than it is in people who do not have PDD.  Long-term (chronic) medical or psychiatric illnesses.  Social factors. Traumatic experiences or major life changes may play a role in the development of PDD. What increases the risk? This condition is more likely to develop in women. The following factors may make you more likely to develop PDD:  A family history of depression.  Abnormally low levels of certain brain chemicals.  Traumatic events in childhood, especially abuse or the loss of a parent.  Being under a lot of stress, or long-term stress, especially from upsetting life experiences or losses.  A history of:  Chronic physical illness.  Other mental health disorders.  Substance abuse.  Poor living conditions.  Experiencing social exclusion or discrimination on a regular basis. What are the signs or symptoms? Symptoms of this condition occur for most of the day, and may include:  Fatigue or low energy.  Eating too much or too little.  Sleeping too much or too little.  Restlessness or agitation.  Feelings of hopelessness.  Feeling worthless or guilty.  Anxiety.  Poor concentration or difficulty making decisions.  Low self-esteem.  Negative outlook.  Inability to have fun or experience pleasure.  Social withdrawal.  Unexplained physical complaints.  Irritability.  Aggressive behavior or anger. How is this diagnosed? This condition may be diagnosed based on:  Your symptoms.  Your medical history, including your mental health history. This may involve tests to evaluate your mental health. You may be asked questions about your lifestyle, including any drug and alcohol use, and how long you have had symptoms of PDD.  A  physical exam.  Blood tests to rule out other conditions. You may be diagnosed with PDD if you have had a depressed mood for 2 years or longer, as well as other symptoms of depression. How is this treated? This condition is usually treated by mental health professionals, such as psychologists, psychiatrists, and clinical social workers. You may need more than one type of treatment. Treatment may include:  Psychotherapy. This is also called talk therapy or counseling. Types of psychotherapy include:  Cognitive behavioral therapy (CBT). This type of therapy teaches you to recognize unhealthy feelings, thoughts, and behaviors, and replace them with positive thoughts and actions.  Interpersonal therapy (IPT). This helps you to improve the way you relate to and communicate with others.  Family therapy. This treatment includes members of your family.  Medicine to treat anxiety and depression, or to help you control certain emotions and behaviors.  Lifestyle changes, such as:  Limiting alcohol and drug use.  Exercising regularly.  Getting plenty of sleep.  Making healthy eating choices.  Spending more time outdoors. Follow these instructions at home: Activity  Return to your normal activities as told by your health care provider.  Exercise regularly and spend time outdoors as told by your health care provider. General instructions  Take over-the-counter and prescription medicines only as told by your health care provider.  Do not drink alcohol. If you drink alcohol, limit your alcohol intake to no more than 1 drink a day for nonpregnant women and 2 drinks a day for men. One drink equals 12 oz of beer, 5 oz of  wine, or 1 oz of hard liquor. Alcohol can affect any antidepressant medicines you are taking. Talk to your health care provider about your alcohol use.  Eat a healthy diet and get plenty of sleep.  Find activities that you enjoy doing, and make time to do them.  Consider  joining a support group. Your health care provider may be able to recommend a support group.  Keep all follow-up visits as told by your health care provider. This is important. Where to find more information: [employer] on Mental Illness  www.nami.org [place] of Mental Health  http://www.maynard.net/ National Suicide Prevention Lifeline  1-800-273-TALK 775-175-3743). This is free, 24-hour help. Contact a health care provider if:  Your symptoms get worse.  You develop new symptoms.  You have trouble sleeping or doing your daily activities. Get help right away if:  You self-harm.  You have serious thoughts about hurting yourself or others.  You see, hear, taste, smell, or feel things that are not present (hallucinate). This information is not intended to replace advice given to you by your health care provider. Make sure you discuss any questions you have with your health care provider. Document Released: 10/31/2012 Document Revised: 07/14/2016 Document Reviewed: 05/28/2016 Elsevier Interactive Patient Education  2017 [employer].

## 2016-11-03 NOTE — MAU Note (Addendum)
Pt states her anxiety is intermittent, has had it for years.  Has been trying something OTC but doesn't know name of medication.  Pt denies suicidal thoughts, states she is very tired, feels very sad.

## 2016-11-03 NOTE — MAU Note (Signed)
Pt stated she is having anxiety and feels depressed. Has h trouble sleeping at night. Denies any thoughts of suicide. [facility]

## 2016-11-03 NOTE — MAU Provider Note (Signed)
  History     CSN: [id number]  Arrival date and time: 11/03/16 [id number]   First Provider Initiated Contact with Patient 11/03/16 1005      Chief Complaint  Patient presents with  . Depression  . Anxiety   HPI Karen Acosta is a 35 y.o. G3P3 who presents to MAU today with complaint of anxiety and depression symptoms x years. The patient states that she often feels shaky and will have to use the bathroom often due to anxiety. She has trouble sleeping and feels depressed. She hasn't seen anyone for this in the past. She states that she was afraid to take medications for fear of addiction. She denies SI or HI today. [facility] didn't know where to [facility] and we were closest to her house.   OB History    Gravida Para Term Preterm AB Living   3 3       3    SAB TAB Ectopic Multiple Live Births                  Past Medical History:  Diagnosis Date  . Anxiety   . Depression     Past Surgical History:  Procedure Laterality Date  . CESAREAN SECTION     C/S x 3    History reviewed. No pertinent family history.  Social History  Substance Use Topics  . Smoking status: Never Smoker  . Smokeless tobacco: Never Used  . Alcohol use No    Allergies: No Known Allergies  No prescriptions prior to admission.    Review of Systems  Constitutional: Negative for fever.  Gastrointestinal: Negative for abdominal pain.  Psychiatric/Behavioral: Positive for depression. Negative for hallucinations, substance abuse and suicidal ideas. The patient is nervous/anxious and has insomnia.    Physical Exam   Blood pressure 100/73, pulse 77, temperature 98.2 F (36.8 C), resp. rate 18, height 4\' 10"  (1.473 m), weight 165 lb 6.4 oz (75 kg), last menstrual period 10/04/2016.  Physical Exam  Nursing note and vitals reviewed. Constitutional: She is oriented to person, place, and time. She appears well-developed and well-nourished. No distress.  HENT:  Head: Normocephalic and  atraumatic.  Eyes: EOM are normal.  Neck: Normal range of motion.  Cardiovascular: Normal rate.   Respiratory: Effort normal.  GI: Soft.  Neurological: She is alert and oriented to person, place, and time.  Skin: Skin is warm and dry. No erythema.  Psychiatric: Judgment normal. Her mood appears not anxious. Her speech is delayed. She is withdrawn. Cognition and memory are normal. She exhibits a depressed mood. She expresses no homicidal and no suicidal ideation.    MAU Course  Procedures None  MDM Patient denies any physical pain or health complaints at this time. She denies pregnancy.   Assessment and Plan  A: Depressed mood Anxiety   P: Discharge home Patient [facility] accept walk-in appointments for new patients between 8am -3pm Patient may return to MAU as needed or if her condition were to change or worsen   Marny LowensteinJulie N Wenzel, PA-C  11/03/2016, 10:35 AM

## 2019-09-04 ENCOUNTER — Other Ambulatory Visit: Payer: Self-pay

## 2019-09-04 ENCOUNTER — Emergency Department (HOSPITAL_BASED_OUTPATIENT_CLINIC_OR_DEPARTMENT_OTHER): Payer: Self-pay

## 2019-09-04 ENCOUNTER — Encounter (HOSPITAL_BASED_OUTPATIENT_CLINIC_OR_DEPARTMENT_OTHER): Payer: Self-pay

## 2019-09-04 ENCOUNTER — Emergency Department (HOSPITAL_BASED_OUTPATIENT_CLINIC_OR_DEPARTMENT_OTHER)
Admission: EM | Admit: 2019-09-04 | Discharge: 2019-09-04 | Disposition: A | Discharge status: Home / Self Care | Payer: Self-pay | Attending: Emergency Medicine | Admitting: Emergency Medicine

## 2019-09-04 DIAGNOSIS — R2241 Localized swelling, mass and lump, right lower limb: Secondary | ICD-10-CM | POA: Insufficient documentation

## 2019-09-04 DIAGNOSIS — M25572 Pain in left ankle and joints of left foot: Secondary | ICD-10-CM | POA: Insufficient documentation

## 2019-09-04 DIAGNOSIS — R52 Pain, unspecified: Secondary | ICD-10-CM

## 2019-09-04 DIAGNOSIS — M25571 Pain in right ankle and joints of right foot: Secondary | ICD-10-CM | POA: Insufficient documentation

## 2019-09-04 NOTE — ED Triage Notes (Signed)
Pt c/o swelling, pain to right LE x 2 days-NAD-steady gait

## 2019-09-04 NOTE — ED Notes (Signed)
Pt states bi lat LE swelling x2 days, worse on right side, pins and needles feeling. Good pop pulses/cap refills bilat. Works 10hr shifts on feet does not wear compression socks. No hx of heart conditions or DVTs. NAD. A&Ox4

## 2019-09-04 NOTE — ED Provider Notes (Signed)
[facility] EMERGENCY DEPARTMENT Provider Note   CSN: [id number] Arrival date & time: 09/04/19  1958     History   Chief Complaint Chief Complaint  Patient presents with   Leg Swelling    HPI Shaney Ahlers is a 38 y.o. female.     The history is provided by the patient and medical records. No language interpreter was used.  Ankle Pain Location:  Ankle Time since incident:  2 days Injury: no   Ankle location:  L ankle and R ankle Pain details:    Quality:  Aching   Radiates to:  Does not radiate   Severity:  Moderate   Onset quality:  Gradual   Duration:  2 days   Timing:  Constant   Progression:  Worsening Chronicity:  New Tetanus status:  Unknown Prior injury to area:  No Relieved by:  Nothing Worsened by:  Bearing weight Ineffective treatments:  None tried Associated symptoms: swelling   Associated symptoms: no back pain, no fatigue, no fever, no muscle weakness, no neck pain, no numbness, no stiffness and no tingling   Risk factors: no frequent fractures and no recent illness     Past Medical History:  Diagnosis Date   Anxiety    Depression     There are no active problems to display for this patient.   Past Surgical History:  Procedure Laterality Date   CESAREAN SECTION     C/S x 3     OB History    Gravida  3   Para  3   Term      Preterm      AB      Living  3     SAB      TAB      Ectopic      Multiple      Live Births               Home Medications    Prior to Admission medications   Medication Sig Start Date End Date Taking? Authorizing Provider  sodium chloride (OCEAN) 0.65 % SOLN nasal spray Place 1 spray into both nostrils as needed for congestion. 07/19/16   Montine Circle, PA-C    Family History No family history on file.  Social History Social History   Tobacco Use   Smoking status: Never Smoker   Smokeless tobacco: Never Used  Substance Use Topics   Alcohol use: No   Drug  use: No     Allergies   Patient has no known allergies.   Review of Systems Review of Systems  Constitutional: Negative for chills, diaphoresis, fatigue and fever.  HENT: Negative for congestion.   Respiratory: Negative for cough, chest tightness and shortness of breath.   Cardiovascular: Positive for leg swelling. Negative for chest pain and palpitations.  Gastrointestinal: Negative for abdominal pain.  Genitourinary: Negative for dysuria and flank pain.  Musculoskeletal: Negative for back pain, neck pain, neck stiffness and stiffness.  Skin: Negative for rash and wound.  Neurological: Negative for weakness, numbness and headaches.  Psychiatric/Behavioral: Negative for agitation.  All other systems reviewed and are negative.    Physical Exam Updated Vital Signs BP 110/82 (BP Location: Left Arm)    Pulse 79    Temp 98.3 F (36.8 C) (Oral)    Resp 18    Ht 5\' 3"  (1.6 m)    Wt 88.5 kg    LMP 08/24/2019    SpO2 100%    BMI 34.54  kg/m   Physical Exam Vitals signs and nursing note reviewed.  Constitutional:      General: She is not in acute distress.    Appearance: She is well-developed. She is not ill-appearing, toxic-appearing or diaphoretic.  HENT:     Head: Normocephalic and atraumatic.     Right Ear: External ear normal.     Left Ear: External ear normal.  Eyes:     Conjunctiva/sclera: Conjunctivae normal.  Neck:     Musculoskeletal: Normal range of motion and neck supple.  Pulmonary:     Effort: No respiratory distress.     Breath sounds: No stridor. No wheezing, rhonchi or rales.  Chest:     Chest wall: No tenderness.  Abdominal:     General: There is no distension.     Tenderness: There is no abdominal tenderness. There is no rebound.  Musculoskeletal:        General: Swelling and tenderness present. No signs of injury.     Right ankle: She exhibits swelling. She exhibits no deformity, no laceration and normal pulse. Tenderness.     Left ankle: She exhibits no  swelling, no deformity, no laceration and normal pulse. Tenderness.     Right lower leg: No edema.     Left lower leg: No edema.  Skin:    General: Skin is warm.     Capillary Refill: Capillary refill takes less than 2 seconds.     Findings: No erythema or rash.  Neurological:     General: No focal deficit present.     Mental Status: She is alert and oriented to person, place, and time.     Sensory: No sensory deficit.     Motor: No weakness or abnormal muscle tone.     Coordination: Coordination normal.     Deep Tendon Reflexes: Reflexes are normal and symmetric. Reflexes normal.  Psychiatric:        Mood and Affect: Mood normal.      ED Treatments / Results  Labs (all labs ordered are listed, but only abnormal results are displayed) Labs Reviewed - No data to display  EKG None  Radiology Dg Ankle Complete Left  Result Date: 09/04/2019 CLINICAL DATA:  Ankle swelling, no known injury, initial encounter EXAM: LEFT ANKLE COMPLETE - 3+ VIEW COMPARISON:  None. FINDINGS: Mild soft tissue swelling is noted particularly laterally. No acute fracture or dislocation is noted. IMPRESSION: Soft tissue swelling without acute bony abnormality Electronically Signed   By: Alcide Clever M.D.   On: 09/04/2019 21:36   Dg Ankle Complete Right  Result Date: 09/04/2019 CLINICAL DATA:  Ankle swelling for 2 days, no known injury, initial encounter EXAM: RIGHT ANKLE - COMPLETE 3+ VIEW COMPARISON:  None. FINDINGS: Lateral soft tissue swelling is noted. No acute fracture or dislocation is noted. IMPRESSION: Soft tissue swelling without acute bony abnormality. Electronically Signed   By: Alcide Clever M.D.   On: 09/04/2019 21:37   US Venous Img Lower Unilateral Right  Result Date: 09/04/2019 CLINICAL DATA:  Bilateral lower extremity pain for 2 days EXAM: BILATERAL LOWER EXTREMITY VENOUS DOPPLER ULTRASOUND TECHNIQUE: Gray-scale sonography with graded compression, as well as color Doppler and duplex  ultrasound were performed to evaluate the lower extremity deep venous systems from the level of the common femoral vein and including the common femoral, femoral, profunda femoral, popliteal and calf veins including the posterior tibial, peroneal and gastrocnemius veins when visible. The superficial great saphenous vein was also interrogated. Spectral Doppler was utilized to  evaluate flow at rest and with distal augmentation maneuvers in the common femoral, femoral and popliteal veins. COMPARISON:  None. FINDINGS: RIGHT LOWER EXTREMITY Common Femoral Vein: No evidence of thrombus. Normal compressibility, respiratory phasicity and response to augmentation. Saphenofemoral Junction: No evidence of thrombus. Normal compressibility and flow on color Doppler imaging. Profunda Femoral Vein: No evidence of thrombus. Normal compressibility and flow on color Doppler imaging. Femoral Vein: No evidence of thrombus. Normal compressibility, respiratory phasicity and response to augmentation. Popliteal Vein: No evidence of thrombus. Normal compressibility, respiratory phasicity and response to augmentation. Calf Veins: Posterior tibial vein is within normal limits. Peroneal vein is not well visualized. Superficial Great Saphenous Vein: No evidence of thrombus. Normal compressibility. Venous Reflux:  None. Other Findings:  None. LEFT LOWER EXTREMITY Common Femoral Vein: No evidence of thrombus. Normal compressibility, respiratory phasicity and response to augmentation. Saphenofemoral Junction: No evidence of thrombus. Normal compressibility and flow on color Doppler imaging. Profunda Femoral Vein: No evidence of thrombus. Normal compressibility and flow on color Doppler imaging. Femoral Vein: No evidence of thrombus. Normal compressibility, respiratory phasicity and response to augmentation. Popliteal Vein: No evidence of thrombus. Normal compressibility, respiratory phasicity and response to augmentation. Calf Veins: No evidence  of thrombus. Normal compressibility and flow on color Doppler imaging. Superficial Great Saphenous Vein: No evidence of thrombus. Normal compressibility. Venous Reflux:  None. Other Findings:  None. IMPRESSION: No evidence of deep venous thrombosis in either lower extremity. Electronically Signed   By: Alcide CleverMark  Lukens M.D.   On: 09/04/2019 21:33    Procedures Procedures (including critical care time)  Medications Ordered in ED Medications - No data to display   Initial Impression / Assessment and Plan / ED Course  I have reviewed the triage vital signs and the nursing notes.  Pertinent labs & imaging results that were available during my care of the patient were reviewed by me and considered in my medical decision making (see chart for details).        Elmon KirschnerZarau Rabenold is a 38 y.o. female with no significant past medical history of bilateral ankle pain and right leg swelling.  Patient reports that she started a new job last week where she was on her feet for approximately 10 hours shift.  She reports her ankles have been hurting worse since then and she has noticed her right leg was more swollen.  She has had ankle pain in the past when she is on too much or exercising but has never had new swelling in the right leg.  No history of DVT or PE.  No recent trauma.  No fevers, chills, chest pain, shortness of breath, nausea vomiting, urinary symptoms.  No other complaints.   On exam, patient does have tenderness of both ankles.  Good pulses, sensation, and strength in feet.  Good appearance with no discoloration and they are warm.  No tenderness in the knees or hips.  Patient does have some tenderness in the right distal calf with some mild edema on the right.  Lungs clear chest nontender.  Abdomen nontender.  Had a shared decision made conversation with patient and I suspect her symptoms are due to overuse with new prolonged standing on her feet without good support in her shoes.  However, we agreed  to get a right leg ultrasound to rule out DVT and bilateral x-rays of the ankles given the ankle tenderness and pain.  Anticipate reassuring imaging and she will be discharged with instructions to take anti-inflammatory medications and use compression  stockings and try to rest if possible to not stay on her feet as long.  Anticipate PCP and podiatry follow-up if work-up is reassuring.  Ultrasound of the leg showed no DVT and x-ray showed soft tissue swelling but no bony abnormality.  Suspect symptoms are related to standing on her feet all day with her new job with out good foot and ankle support.  Patient given instructions on peripheral edema management with compression stockings and will be given instructions to follow-up with podiatry.  Patient will take anti-inflammatory medication and emergency return precautions.  She no other questions or concerns and was discharged in good condition.  Final Clinical Impressions(s) / ED Diagnoses   Final diagnoses:  Acute bilateral ankle pain    ED Discharge Orders    None      Clinical Impression: 1. Pain   2. Acute bilateral ankle pain     Disposition: Discharge  Condition: Good  I have discussed the results, Dx and Tx plan with the pt(& family if present). He/she/they expressed understanding and agree(s) with the plan. Discharge instructions discussed at great length. Strict return precautions discussed and pt &/or family have verbalized understanding of the instructions. No further questions at time of discharge.    Discharge Medication List as of 09/04/2019 11:17 PM      Follow Up: Felecia Shelling, DPM [address] [place] [address] [phone]        Deakin Lacek, Canary Brim, MD 09/04/19 2351

## 2019-09-04 NOTE — Discharge Instructions (Signed)
Your history and exam today were overall reassuring.  I suspect that your bilateral ankle pain and mild swelling is related to your new job where you are standing on her feet much longer than you are used to.  Please follow-up with a podiatrist to discuss further foot and shoe support and please use the compression stockings as we discussed.  The ultrasounds were negative for blood clots.  Please use over-the-counter anti-inflammatory medication scheduled and rest with your feet up.  If any symptoms change or worsen, please return to the nearest emergency department.

## 2020-04-26 IMAGING — CR DG ANKLE COMPLETE 3+V*L*
4 series · 4 of 4 positions shown · non-contrast
Comparison: None.

CLINICAL DATA: Ankle swelling, no known injury, initial encounter

EXAM:
LEFT ANKLE COMPLETE - 3+ VIEW

[t ankle joint ap left]
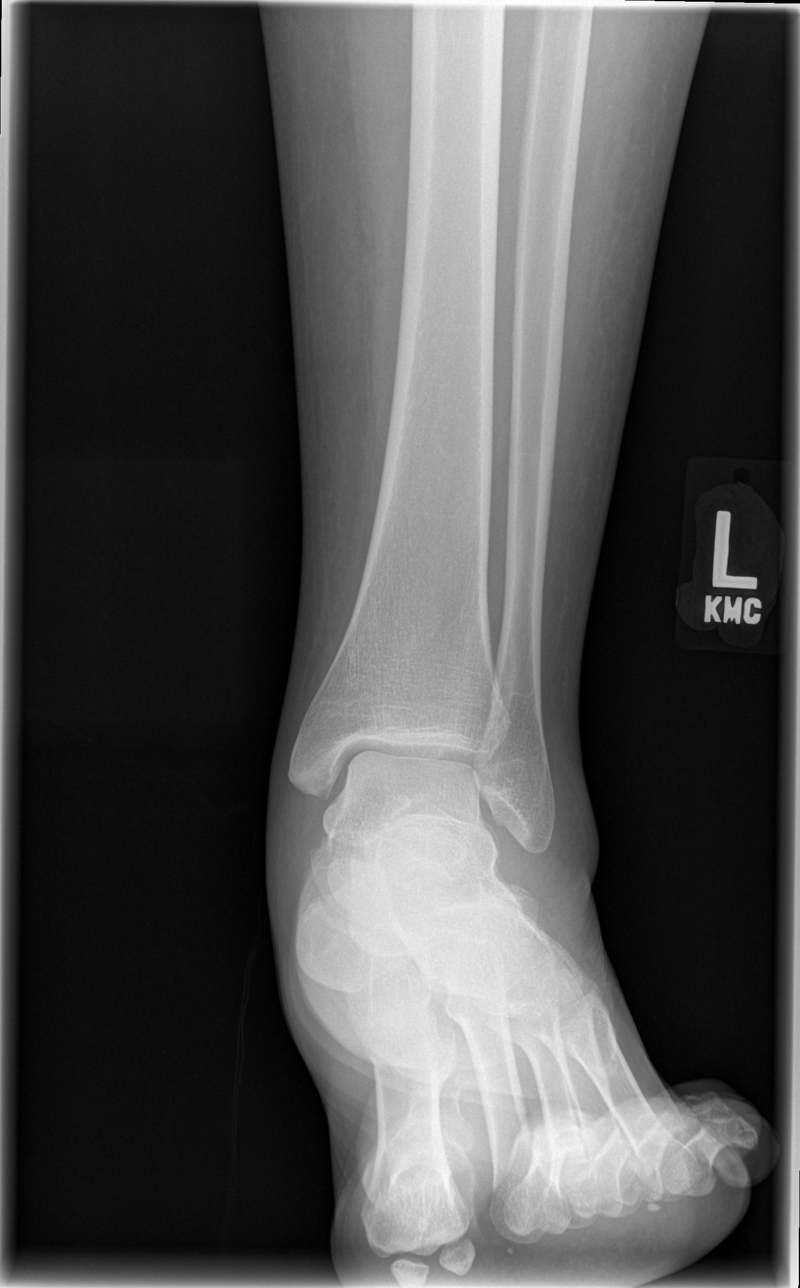

[t ankle joint oblique left (1 of 2)]
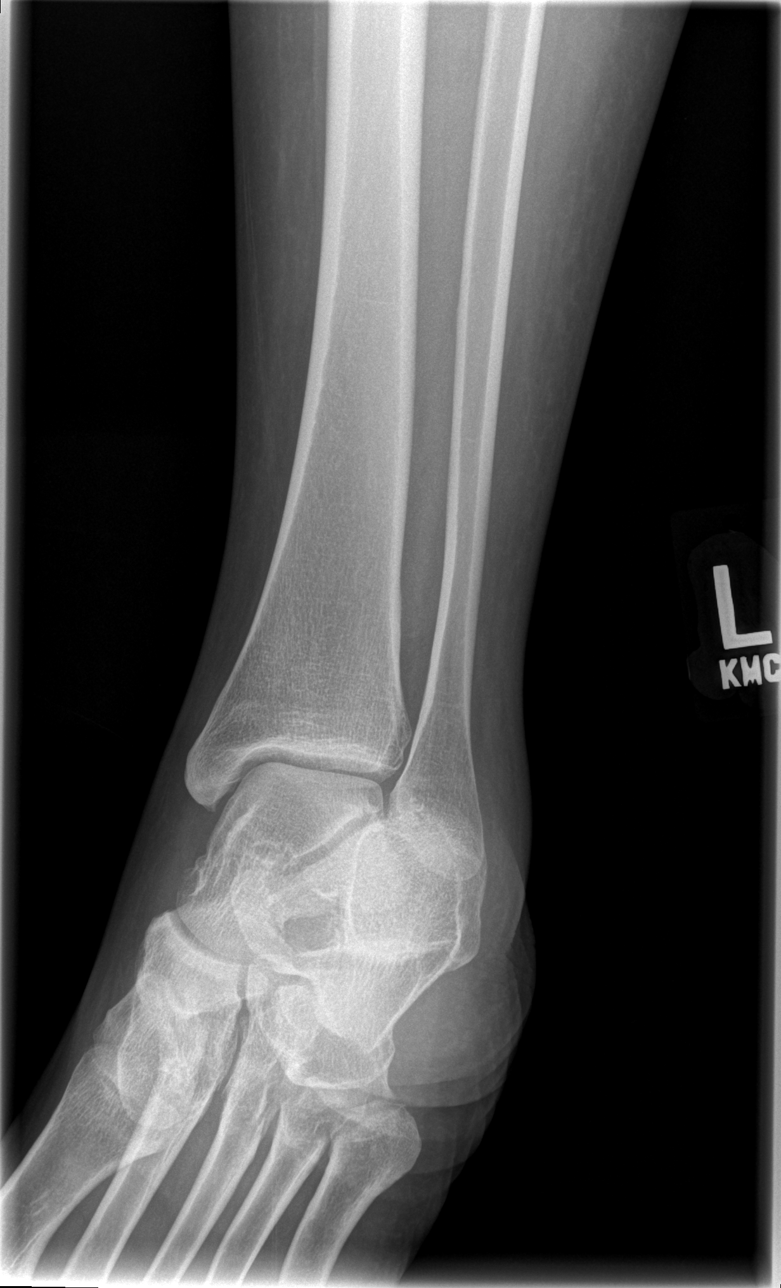

[t ankle joint oblique left (2 of 2)]
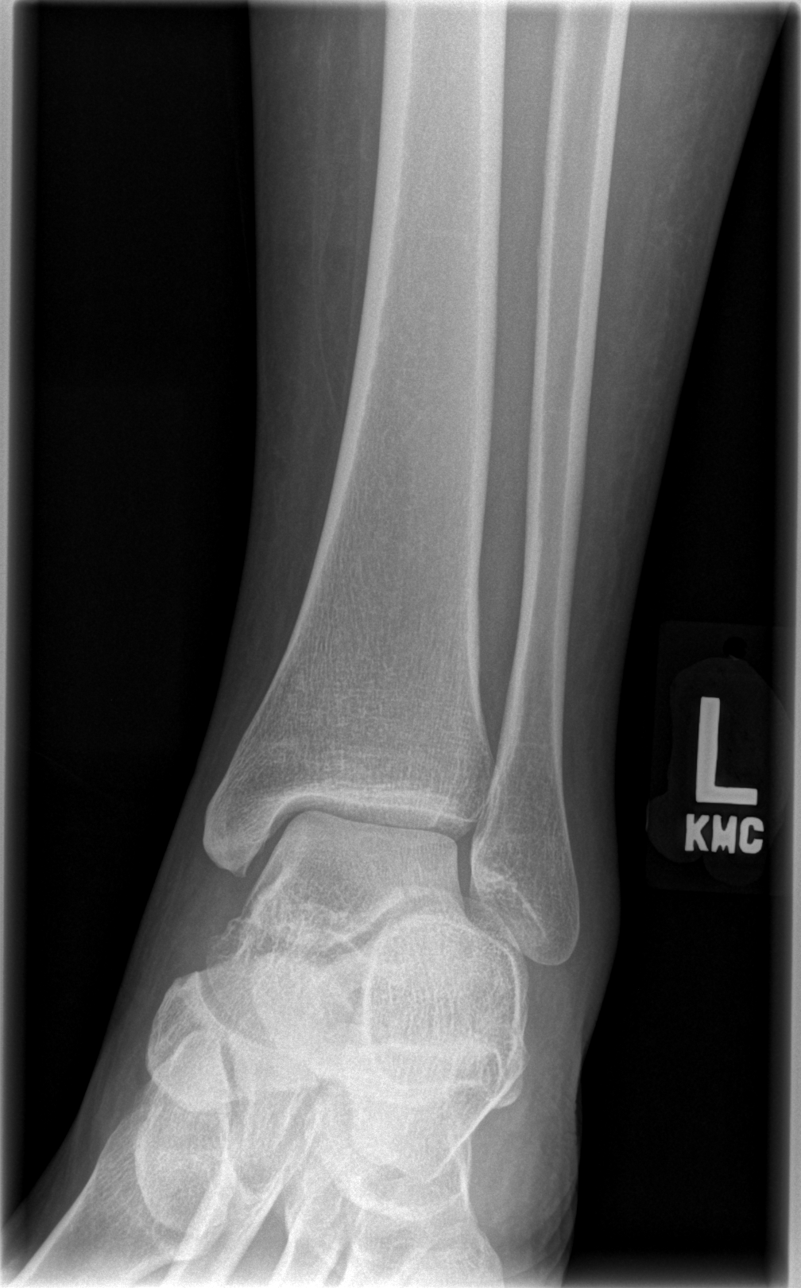

[t ankle joint lat left]
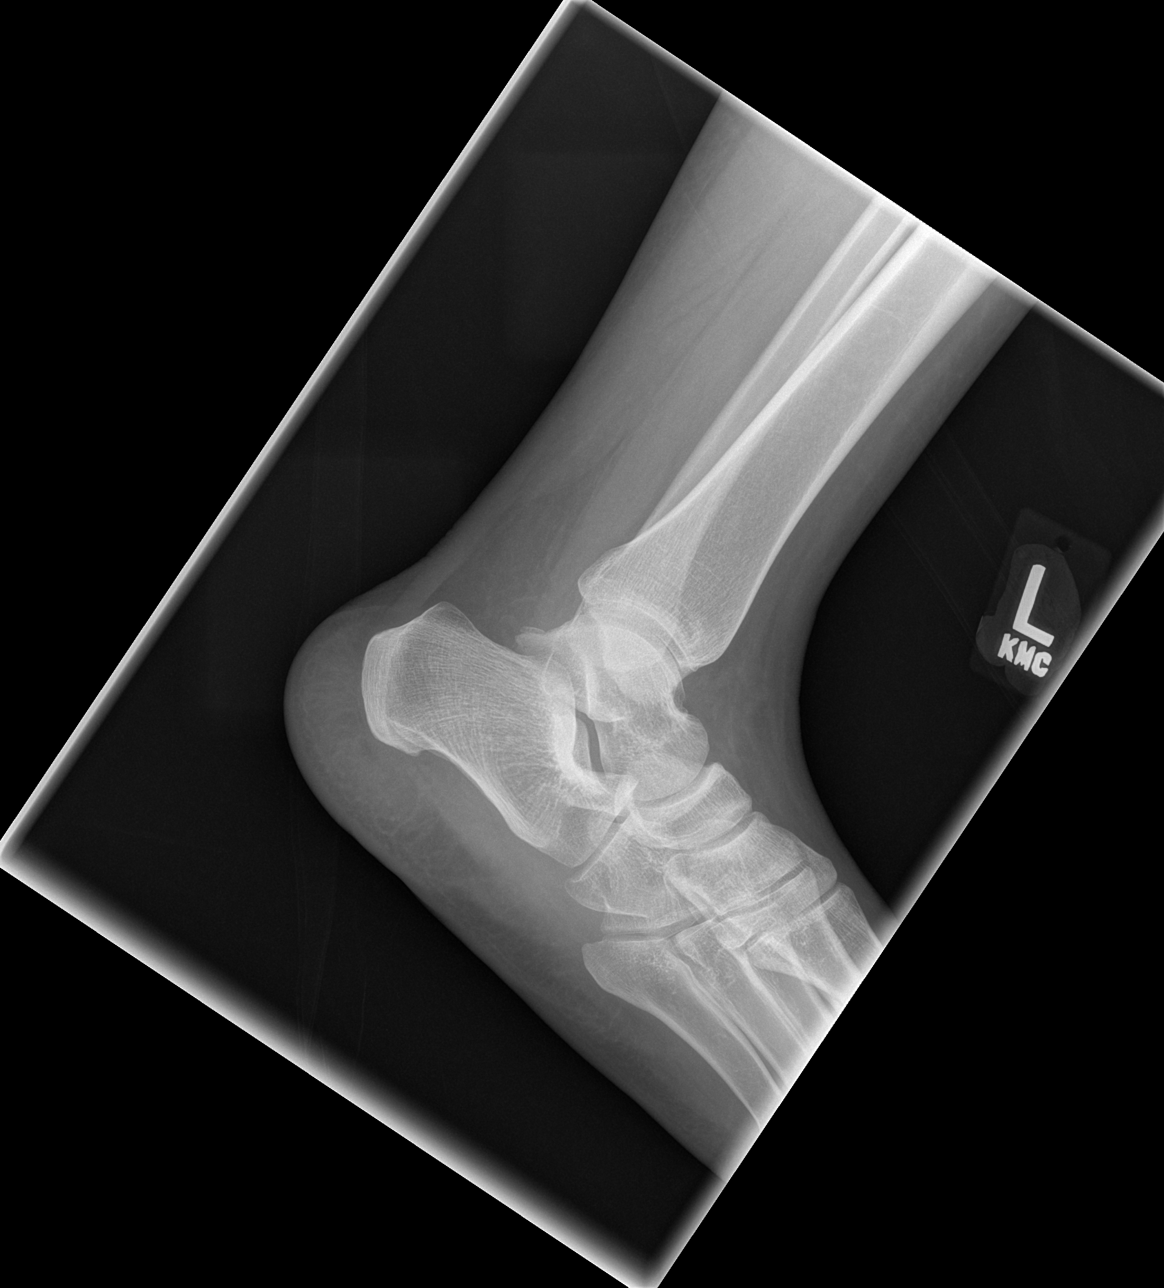

[4 of 4 positions shown; findings below may reference images not displayed]

FINDINGS: Mild soft tissue swelling is noted particularly laterally. No acute
fracture or dislocation is noted.
IMPRESSION: Soft tissue swelling without acute bony abnormality

## 2022-12-12 ENCOUNTER — Encounter (HOSPITAL_BASED_OUTPATIENT_CLINIC_OR_DEPARTMENT_OTHER): Payer: Self-pay | Admitting: *Deleted

## 2022-12-12 ENCOUNTER — Other Ambulatory Visit: Payer: Self-pay

## 2022-12-12 DIAGNOSIS — U071 COVID-19: Secondary | ICD-10-CM | POA: Insufficient documentation

## 2022-12-12 DIAGNOSIS — R509 Fever, unspecified: Secondary | ICD-10-CM | POA: Diagnosis present

## 2022-12-12 LAB — RESP PANEL BY RT-PCR (RSV, FLU A&B, COVID)  RVPGX2
Influenza A by PCR: NEGATIVE
Influenza B by PCR: NEGATIVE
Resp Syncytial Virus by PCR: NEGATIVE
SARS Coronavirus 2 by RT PCR: POSITIVE — AB

## 2022-12-12 NOTE — ED Triage Notes (Signed)
Pt is here for illness with URI, fever, body aches and fatigue x3 days. Family is also here with similar symptoms. Has tylenol flu and ibuprofen at home.

## 2022-12-13 ENCOUNTER — Emergency Department (HOSPITAL_BASED_OUTPATIENT_CLINIC_OR_DEPARTMENT_OTHER)
Admission: EM | Admit: 2022-12-13 | Discharge: 2022-12-13 | Disposition: A | Discharge status: Home / Self Care | Payer: Medicaid Other | Attending: Emergency Medicine | Admitting: Emergency Medicine

## 2022-12-13 DIAGNOSIS — U071 COVID-19: Secondary | ICD-10-CM

## 2022-12-13 NOTE — ED Provider Notes (Signed)
[facility] Provider Note: Karen Spurling, MD, FACEP  CSN: [id number] MRN: [id number] ARRIVAL: 12/12/22 at [place]: [facility]  12/13/22 2:43 AM Karen [facility] is a 42 y.o. female with 3 days of URI symptoms.  Specifically she has had nasal congestion, cough, fever, body aches, fatigue, loss of taste and smell.  She rates her body aches as a 6 out of 10.  She has been taking Tylenol and ibuprofen at home.  She is here with her 2 nieces who have similar symptoms.    Past Medical History:  Diagnosis Date   Anxiety    Depression     Past Surgical History:  Procedure Laterality Date   CESAREAN SECTION     C/S x 3    History reviewed. No pertinent family history.  Social History   Tobacco Use   Smoking status: Never   Smokeless tobacco: Never  Vaping Use   Vaping Use: Never used  Substance Use Topics   Alcohol use: No   Drug use: No    Prior to Admission medications   Medication Sig Start Date End Date Taking? Authorizing Provider  sodium chloride (OCEAN) 0.65 % SOLN nasal spray Place 1 spray into both nostrils as needed for congestion. 07/19/16   Montine Circle, PA-C    Allergies Patient has no known allergies.   REVIEW OF SYSTEMS  Negative except as noted here or in the History of Present Illness.   PHYSICAL EXAMINATION  Initial Vital Signs Blood pressure 109/81, pulse 91, temperature 98.5 F (36.9 C), temperature source Oral, resp. rate 18, height 5\' 2"  (1.575 m), weight 86.2 kg, last menstrual period 12/10/2022, SpO2 97 %.  Examination General: Well-developed, well-nourished female in no acute distress; appearance consistent with age of record HENT: normocephalic; atraumatic Eyes: Normal appearance Neck: supple Heart: regular rate and rhythm Lungs: clear to auscultation bilaterally Abdomen: soft; nondistended; nontender; bowel sounds present Extremities: No deformity; full range  of motion Neurologic: Awake, alert and oriented; motor function intact in all extremities and symmetric; no facial droop Skin: Warm and dry Psychiatric: Normal mood and affect   RESULTS  Summary of this visit's results, reviewed and interpreted by myself:   EKG Interpretation  Date/Time:    Ventricular Rate:    PR Interval:    QRS Duration:   QT Interval:    QTC Calculation:   R Axis:     Text Interpretation:         Laboratory Studies: Results for orders placed or performed during the hospital encounter of 12/13/22 (from the past 24 hour(s))  Resp panel by RT-PCR (RSV, Flu A&B, Covid) Anterior Nasal Swab     Status: Abnormal   Collection Time: 12/12/22 10:58 PM   Specimen: Anterior Nasal Swab  Result Value Ref Range   SARS Coronavirus 2 by RT PCR POSITIVE (A) NEGATIVE   Influenza A by PCR NEGATIVE NEGATIVE   Influenza B by PCR NEGATIVE NEGATIVE   Resp Syncytial Virus by PCR NEGATIVE NEGATIVE   Imaging Studies: No results found.  ED COURSE and MDM  Nursing notes, initial and subsequent vitals signs, including pulse oximetry, reviewed and interpreted by myself.  Vitals:   12/12/22 2253 12/12/22 2256  BP: 109/81   Pulse: 91   Resp: 18   Temp: 98.5 F (36.9 C)   TempSrc: Oral   SpO2: 97%   Weight:  86.2 kg  Height:  5\' 2"  (1.575 m)  Medications - No data to display  The patient and one of her 2 nieces have tested positive for COVID and I suspect the other niece will test positive if tested again as her symptoms are of recent onset.  The patient does not meet any criteria for Paxlovid or molnupiravir.    PROCEDURES  Procedures   ED DIAGNOSES     ICD-10-CM   1. COVID-19 virus infection  U07.1          [name], MD 12/13/22 [id number]

## 2023-07-04 ENCOUNTER — Other Ambulatory Visit: Payer: Self-pay | Admitting: Nurse Practitioner

## 2023-07-04 DIAGNOSIS — Z1231 Encounter for screening mammogram for malignant neoplasm of breast: Secondary | ICD-10-CM

## 2023-08-15 ENCOUNTER — Ambulatory Visit
Admission: RE | Admit: 2023-08-15 | Discharge: 2023-08-15 | Disposition: A | Discharge status: Home / Self Care | Payer: Medicaid Other | Source: Ambulatory Visit | Attending: Nurse Practitioner | Admitting: Nurse Practitioner

## 2023-08-15 DIAGNOSIS — Z1231 Encounter for screening mammogram for malignant neoplasm of breast: Secondary | ICD-10-CM

## 2023-08-21 ENCOUNTER — Other Ambulatory Visit: Payer: Self-pay | Admitting: Nurse Practitioner

## 2023-08-21 DIAGNOSIS — R928 Other abnormal and inconclusive findings on diagnostic imaging of breast: Secondary | ICD-10-CM

## 2023-09-04 ENCOUNTER — Encounter: Payer: Self-pay | Admitting: Nurse Practitioner

## 2023-09-05 ENCOUNTER — Other Ambulatory Visit: Payer: Self-pay | Admitting: Registered Nurse

## 2023-09-05 DIAGNOSIS — R928 Other abnormal and inconclusive findings on diagnostic imaging of breast: Secondary | ICD-10-CM

## 2023-09-06 ENCOUNTER — Ambulatory Visit
Admission: RE | Admit: 2023-09-06 | Discharge: 2023-09-06 | Disposition: A | Discharge status: Home / Self Care | Payer: Medicaid Other | Source: Ambulatory Visit | Attending: Nurse Practitioner | Admitting: Nurse Practitioner

## 2023-09-06 ENCOUNTER — Other Ambulatory Visit: Payer: Self-pay | Admitting: Registered Nurse

## 2023-09-06 DIAGNOSIS — R921 Mammographic calcification found on diagnostic imaging of breast: Secondary | ICD-10-CM

## 2023-09-06 DIAGNOSIS — R928 Other abnormal and inconclusive findings on diagnostic imaging of breast: Secondary | ICD-10-CM

## 2024-01-23 ENCOUNTER — Ambulatory Visit
Admission: RE | Admit: 2024-01-23 | Discharge: 2024-01-23 | Disposition: A | Discharge status: Home / Self Care | Payer: Medicaid Other | Source: Ambulatory Visit | Attending: Registered Nurse | Admitting: Registered Nurse

## 2024-01-23 DIAGNOSIS — R921 Mammographic calcification found on diagnostic imaging of breast: Secondary | ICD-10-CM

## 2024-03-13 ENCOUNTER — Ambulatory Visit
Admission: RE | Admit: 2024-03-13 | Discharge: 2024-03-13 | Disposition: A | Discharge status: Home / Self Care | Payer: Medicaid Other | Source: Ambulatory Visit | Attending: Registered Nurse | Admitting: Registered Nurse

## 2024-06-22 ENCOUNTER — Emergency Department (HOSPITAL_COMMUNITY)
Admission: EM | Admit: 2024-06-22 | Discharge: 2024-06-22 | Discharge status: Against Medical Advice | Attending: Emergency Medicine | Admitting: Emergency Medicine

## 2024-06-22 ENCOUNTER — Encounter (HOSPITAL_COMMUNITY): Payer: Self-pay | Admitting: Emergency Medicine

## 2024-06-22 ENCOUNTER — Other Ambulatory Visit: Payer: Self-pay

## 2024-06-22 DIAGNOSIS — Z563 Stressful work schedule: Secondary | ICD-10-CM | POA: Diagnosis present

## 2024-06-22 DIAGNOSIS — R45851 Suicidal ideations: Secondary | ICD-10-CM | POA: Insufficient documentation

## 2024-06-22 DIAGNOSIS — Z5321 Procedure and treatment not carried out due to patient leaving prior to being seen by health care provider: Secondary | ICD-10-CM | POA: Diagnosis not present

## 2024-06-22 LAB — CBC
HCT: 35.2 % — ABNORMAL LOW (ref 36.0–46.0)
Hemoglobin: 11.2 g/dL — ABNORMAL LOW (ref 12.0–15.0)
MCH: 25.9 pg — ABNORMAL LOW (ref 26.0–34.0)
MCHC: 31.8 g/dL (ref 30.0–36.0)
MCV: 81.5 fL (ref 80.0–100.0)
Platelets: 322 K/uL (ref 150–400)
RBC: 4.32 MIL/uL (ref 3.87–5.11)
RDW: 15.9 % — ABNORMAL HIGH (ref 11.5–15.5)
WBC: 7.7 K/uL (ref 4.0–10.5)
nRBC: 0 % (ref 0.0–0.2)

## 2024-06-22 LAB — COMPREHENSIVE METABOLIC PANEL WITH GFR
ALT: 14 U/L (ref 0–44)
AST: 15 U/L (ref 15–41)
Albumin: 3.9 g/dL (ref 3.5–5.0)
Alkaline Phosphatase: 56 U/L (ref 38–126)
Anion gap: 10 (ref 5–15)
BUN: 8 mg/dL (ref 6–20)
CO2: 18 mmol/L — ABNORMAL LOW (ref 22–32)
Calcium: 9.3 mg/dL (ref 8.9–10.3)
Chloride: 109 mmol/L (ref 98–111)
Creatinine, Ser: 0.53 mg/dL (ref 0.44–1.00)
GFR, Estimated: >60 mL/min (ref >60)
Glucose, Bld: 112 mg/dL — ABNORMAL HIGH (ref 70–99)
Potassium: 3.7 mmol/L (ref 3.5–5.1)
Sodium: 137 mmol/L (ref 135–145)
Total Bilirubin: 0.5 mg/dL (ref 0.0–1.2)
Total Protein: 7.3 g/dL (ref 6.5–8.1)

## 2024-06-22 LAB — RAPID URINE DRUG SCREEN, HOSP PERFORMED
Amphetamines: NOT DETECTED
Barbiturates: NOT DETECTED
Benzodiazepines: NOT DETECTED
Cocaine: NOT DETECTED
Opiates: NOT DETECTED
Tetrahydrocannabinol: NOT DETECTED

## 2024-06-22 LAB — ETHANOL: Alcohol, Ethyl (B): <15 mg/dL (ref <15)

## 2024-06-22 LAB — HCG, SERUM, QUALITATIVE: Preg, Serum: NEGATIVE

## 2024-06-22 NOTE — ED Notes (Addendum)
  Family of patient came out to desk stating that they wanted to take her home.  Patient has yet to be seen by EDP and is not IVC'd.  Was advised that patient stay and complete care but I told them I could not force them to stay.  Family stated they would bring her back if she got worse.

## 2024-06-22 NOTE — ED Triage Notes (Signed)
  Patient BIB family for suicidal thoughts for the past week.  Family states that patient has been working a lot and stressed out lately.  Has been saying that she wanted to kill herself by jumping off a bridge.  No physical attempts at this point.  States she has not sleep in a week.  Pain 8/10, headache.  Denies any ETOH/drug use.

## 2024-06-22 NOTE — ED Notes (Signed)
 Pt has been dressed out and belongings have been given to her sister.  Pt has been wanded by security.

## 2024-10-03 ENCOUNTER — Other Ambulatory Visit: Payer: Self-pay | Admitting: Physician Assistant

## 2024-10-03 DIAGNOSIS — Z1231 Encounter for screening mammogram for malignant neoplasm of breast: Secondary | ICD-10-CM

## 2025-03-18 ENCOUNTER — Ambulatory Visit
